# Patient Record
Sex: Female | Born: 1995 | Hispanic: No | Marital: Single | Smoking: Never smoker
Health system: Southern US, Community
[De-identification: ages and names within clinical notes are randomized; demographics above are authoritative.]

## PROBLEM LIST (undated history)

## (undated) DIAGNOSIS — Z9109 Other allergy status, other than to drugs and biological substances: Secondary | ICD-10-CM

## (undated) HISTORY — PX: KNEE SURGERY: SHX244

---
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
## 2015-12-18 ENCOUNTER — Emergency Department (HOSPITAL_COMMUNITY): Payer: BLUE CROSS/BLUE SHIELD

## 2015-12-18 ENCOUNTER — Encounter (HOSPITAL_COMMUNITY): Payer: Self-pay | Admitting: Emergency Medicine

## 2015-12-18 ENCOUNTER — Emergency Department (HOSPITAL_COMMUNITY)
Admission: EM | Admit: 2015-12-18 | Discharge: 2015-12-18 | Disposition: A | Discharge status: Home / Self Care | Payer: BLUE CROSS/BLUE SHIELD | Attending: Emergency Medicine | Admitting: Emergency Medicine

## 2015-12-18 DIAGNOSIS — Y92410 Unspecified street and highway as the place of occurrence of the external cause: Secondary | ICD-10-CM | POA: Diagnosis not present

## 2015-12-18 DIAGNOSIS — M542 Cervicalgia: Secondary | ICD-10-CM | POA: Diagnosis not present

## 2015-12-18 DIAGNOSIS — R51 Headache: Secondary | ICD-10-CM | POA: Diagnosis not present

## 2015-12-18 DIAGNOSIS — Y999 Unspecified external cause status: Secondary | ICD-10-CM | POA: Diagnosis not present

## 2015-12-18 DIAGNOSIS — M546 Pain in thoracic spine: Secondary | ICD-10-CM | POA: Diagnosis not present

## 2015-12-18 DIAGNOSIS — Y939 Activity, unspecified: Secondary | ICD-10-CM | POA: Diagnosis not present

## 2015-12-18 LAB — CBC
HCT: 38.5 % (ref 36.0–46.0)
Hemoglobin: 12.3 g/dL (ref 12.0–15.0)
MCH: 27.2 pg (ref 26.0–34.0)
MCHC: 31.9 g/dL (ref 30.0–36.0)
MCV: 85 fL (ref 78.0–100.0)
PLATELETS: 335 10*3/uL (ref 150–400)
RBC: 4.53 MIL/uL (ref 3.87–5.11)
RDW: 12.2 % (ref 11.5–15.5)
WBC: 4.7 10*3/uL (ref 4.0–10.5)

## 2015-12-18 LAB — COMPREHENSIVE METABOLIC PANEL
ALT: 29 U/L (ref 14–54)
AST: 20 U/L (ref 15–41)
Albumin: 3.7 g/dL (ref 3.5–5.0)
Alkaline Phosphatase: 58 U/L (ref 38–126)
Anion gap: 8 (ref 5–15)
BUN: 6 mg/dL (ref 6–20)
CALCIUM: 8.7 mg/dL — AB (ref 8.9–10.3)
CO2: 21 mmol/L — ABNORMAL LOW (ref 22–32)
CREATININE: 0.87 mg/dL (ref 0.44–1.00)
Chloride: 108 mmol/L (ref 101–111)
GFR calc non Af Amer: >60 mL/min (ref >60)
Glucose, Bld: 90 mg/dL (ref 65–99)
Potassium: 4 mmol/L (ref 3.5–5.1)
Sodium: 137 mmol/L (ref 135–145)
Total Bilirubin: 0.4 mg/dL (ref 0.3–1.2)
Total Protein: 6.6 g/dL (ref 6.5–8.1)

## 2015-12-18 LAB — I-STAT BETA HCG BLOOD, ED (MC, WL, AP ONLY): I-stat hCG, quantitative: <5 m[IU]/mL (ref <5)

## 2015-12-18 LAB — I-STAT CG4 LACTIC ACID, ED: Lactic Acid, Venous: 1.38 mmol/L (ref 0.5–1.9)

## 2015-12-18 MED ORDER — ONDANSETRON HCL 4 MG/2ML IJ SOLN
4.0000 mg | Freq: Once | INTRAMUSCULAR | Status: AC
  Administered 2015-12-18: 4 mg via INTRAVENOUS
  Filled 2015-12-18: qty 2

## 2015-12-18 MED ORDER — FENTANYL CITRATE (PF) 100 MCG/2ML IJ SOLN
25.0000 ug | Freq: Once | INTRAMUSCULAR | Status: AC
  Administered 2015-12-18: 25 ug via INTRAVENOUS
  Filled 2015-12-18: qty 2

## 2015-12-18 MED ORDER — KETOROLAC TROMETHAMINE 15 MG/ML IJ SOLN
15.0000 mg | Freq: Once | INTRAMUSCULAR | Status: AC
  Administered 2015-12-18: 15 mg via INTRAVENOUS
  Filled 2015-12-18: qty 1
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
## 2015-12-18 NOTE — ED Notes (Signed)
Pt in radiology 

## 2015-12-18 NOTE — ED Notes (Signed)
C-collar placed by EMS

## 2015-12-18 NOTE — ED Notes (Signed)
PT stating that her pain is increasing 8/10 and that she would like something to eat.  MD notified and pt given happy meal per MD.

## 2015-12-19 ENCOUNTER — Emergency Department (HOSPITAL_COMMUNITY)
Admission: EM | Admit: 2015-12-19 | Discharge: 2015-12-19 | Disposition: A | Discharge status: Home / Self Care | Payer: BLUE CROSS/BLUE SHIELD | Attending: Emergency Medicine | Admitting: Emergency Medicine

## 2015-12-19 ENCOUNTER — Encounter (HOSPITAL_COMMUNITY): Payer: Self-pay | Admitting: Emergency Medicine

## 2015-12-19 DIAGNOSIS — M542 Cervicalgia: Secondary | ICD-10-CM | POA: Diagnosis present

## 2015-12-19 DIAGNOSIS — Y939 Activity, unspecified: Secondary | ICD-10-CM | POA: Insufficient documentation

## 2015-12-19 DIAGNOSIS — Y92410 Unspecified street and highway as the place of occurrence of the external cause: Secondary | ICD-10-CM | POA: Diagnosis not present

## 2015-12-19 DIAGNOSIS — Y999 Unspecified external cause status: Secondary | ICD-10-CM | POA: Diagnosis not present

## 2015-12-19 MED ORDER — HYDROCODONE-ACETAMINOPHEN 5-325 MG PO TABS
1.0000 | ORAL_TABLET | Freq: Once | ORAL | Status: AC
  Administered 2015-12-19: 1 via ORAL
  Filled 2015-12-19: qty 1

## 2015-12-19 MED ORDER — IBUPROFEN 800 MG PO TABS: 800.0000 mg | ORAL_TABLET | Freq: Three times a day (TID) | ORAL | 0 refills | Status: DC

## 2015-12-19 MED ORDER — CYCLOBENZAPRINE HCL 10 MG PO TABS: 10.0000 mg | ORAL_TABLET | Freq: Two times a day (BID) | ORAL | 0 refills | Status: DC | PRN
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
## 2015-12-21 ENCOUNTER — Encounter (HOSPITAL_COMMUNITY): Payer: Self-pay | Admitting: Emergency Medicine

## 2015-12-21 DIAGNOSIS — Y999 Unspecified external cause status: Secondary | ICD-10-CM | POA: Diagnosis not present

## 2015-12-21 DIAGNOSIS — S161XXA Strain of muscle, fascia and tendon at neck level, initial encounter: Secondary | ICD-10-CM | POA: Diagnosis not present

## 2015-12-21 DIAGNOSIS — Y939 Activity, unspecified: Secondary | ICD-10-CM | POA: Insufficient documentation

## 2015-12-21 DIAGNOSIS — Y92410 Unspecified street and highway as the place of occurrence of the external cause: Secondary | ICD-10-CM | POA: Insufficient documentation

## 2015-12-21 DIAGNOSIS — S199XXA Unspecified injury of neck, initial encounter: Secondary | ICD-10-CM | POA: Diagnosis present

## 2015-12-21 LAB — URINALYSIS, ROUTINE W REFLEX MICROSCOPIC
BILIRUBIN URINE: NEGATIVE
GLUCOSE, UA: NEGATIVE mg/dL
KETONES UR: NEGATIVE mg/dL
Nitrite: NEGATIVE
PROTEIN: NEGATIVE mg/dL
Specific Gravity, Urine: 1.02 (ref 1.005–1.030)
pH: 7 (ref 5.0–8.0)

## 2015-12-21 LAB — COMPREHENSIVE METABOLIC PANEL
ALBUMIN: 3.6 g/dL (ref 3.5–5.0)
ALK PHOS: 61 U/L (ref 38–126)
ALT: 35 U/L (ref 14–54)
AST: 24 U/L (ref 15–41)
Anion gap: 8 (ref 5–15)
BILIRUBIN TOTAL: 0.4 mg/dL (ref 0.3–1.2)
BUN: 9 mg/dL (ref 6–20)
CALCIUM: 8.9 mg/dL (ref 8.9–10.3)
CO2: 25 mmol/L (ref 22–32)
CREATININE: 0.88 mg/dL (ref 0.44–1.00)
Chloride: 104 mmol/L (ref 101–111)
GFR calc Af Amer: >60 mL/min (ref >60)
GLUCOSE: 111 mg/dL — AB (ref 65–99)
POTASSIUM: 3.8 mmol/L (ref 3.5–5.1)
Sodium: 137 mmol/L (ref 135–145)
TOTAL PROTEIN: 6.6 g/dL (ref 6.5–8.1)

## 2015-12-21 LAB — CBC WITH DIFFERENTIAL/PLATELET
BASOS PCT: 0 %
Basophils Absolute: 0 10*3/uL (ref 0.0–0.1)
Eosinophils Absolute: 0.2 10*3/uL (ref 0.0–0.7)
Eosinophils Relative: 5 %
HEMATOCRIT: 38.8 % (ref 36.0–46.0)
Hemoglobin: 12.4 g/dL (ref 12.0–15.0)
Lymphocytes Relative: 37 %
Lymphs Abs: 1.8 10*3/uL (ref 0.7–4.0)
MCH: 27.3 pg (ref 26.0–34.0)
MCHC: 32 g/dL (ref 30.0–36.0)
MCV: 85.5 fL (ref 78.0–100.0)
MONO ABS: 0.5 10*3/uL (ref 0.1–1.0)
MONOS PCT: 9 %
NEUTROS ABS: 2.5 10*3/uL (ref 1.7–7.7)
Neutrophils Relative %: 49 %
Platelets: 311 10*3/uL (ref 150–400)
RBC: 4.54 MIL/uL (ref 3.87–5.11)
RDW: 12.2 % (ref 11.5–15.5)
WBC: 5 10*3/uL (ref 4.0–10.5)

## 2015-12-21 LAB — URINE MICROSCOPIC-ADD ON

## 2015-12-21 LAB — POC URINE PREG, ED: Preg Test, Ur: NEGATIVE

## 2015-12-21 NOTE — ED Notes (Signed)
Pt. reevaluated , reports mild dizziness/lightheaded , VS repeated , nurse explained delay , process and wait time to pt.
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
## 2015-12-22 ENCOUNTER — Emergency Department (HOSPITAL_COMMUNITY)
Admission: EM | Admit: 2015-12-22 | Discharge: 2015-12-22 | Disposition: A | Discharge status: Home / Self Care | Payer: BLUE CROSS/BLUE SHIELD | Attending: Emergency Medicine | Admitting: Emergency Medicine

## 2015-12-22 DIAGNOSIS — M7918 Myalgia, other site: Secondary | ICD-10-CM

## 2015-12-22 DIAGNOSIS — S161XXA Strain of muscle, fascia and tendon at neck level, initial encounter: Secondary | ICD-10-CM

## 2015-12-22 NOTE — ED Notes (Signed)
Pt verbalized understanding of discharge instructions and follow up care
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
## 2016-10-17 ENCOUNTER — Emergency Department (HOSPITAL_COMMUNITY)
Admission: EM | Admit: 2016-10-17 | Discharge: 2016-10-17 | Disposition: A | Discharge status: Home / Self Care | Payer: BLUE CROSS/BLUE SHIELD | Attending: Emergency Medicine | Admitting: Emergency Medicine

## 2016-10-17 ENCOUNTER — Emergency Department (HOSPITAL_COMMUNITY): Payer: BLUE CROSS/BLUE SHIELD

## 2016-10-17 ENCOUNTER — Encounter (HOSPITAL_COMMUNITY): Payer: Self-pay | Admitting: Emergency Medicine

## 2016-10-17 DIAGNOSIS — Z79899 Other long term (current) drug therapy: Secondary | ICD-10-CM | POA: Insufficient documentation

## 2016-10-17 DIAGNOSIS — Y92481 Parking lot as the place of occurrence of the external cause: Secondary | ICD-10-CM | POA: Diagnosis not present

## 2016-10-17 DIAGNOSIS — S161XXA Strain of muscle, fascia and tendon at neck level, initial encounter: Secondary | ICD-10-CM | POA: Insufficient documentation

## 2016-10-17 DIAGNOSIS — Y999 Unspecified external cause status: Secondary | ICD-10-CM | POA: Diagnosis not present

## 2016-10-17 DIAGNOSIS — Y939 Activity, unspecified: Secondary | ICD-10-CM | POA: Diagnosis not present

## 2016-10-17 DIAGNOSIS — M546 Pain in thoracic spine: Secondary | ICD-10-CM | POA: Insufficient documentation

## 2016-10-17 DIAGNOSIS — S199XXA Unspecified injury of neck, initial encounter: Secondary | ICD-10-CM | POA: Diagnosis present

## 2016-10-17 DIAGNOSIS — R0789 Other chest pain: Secondary | ICD-10-CM | POA: Insufficient documentation

## 2016-10-17 DIAGNOSIS — M25511 Pain in right shoulder: Secondary | ICD-10-CM | POA: Diagnosis not present

## 2016-10-17 DIAGNOSIS — T148XXA Other injury of unspecified body region, initial encounter: Secondary | ICD-10-CM

## 2016-10-17 LAB — POC URINE PREG, ED: Preg Test, Ur: NEGATIVE

## 2016-10-17 MED ORDER — IBUPROFEN 600 MG PO TABS: 600.0000 mg | ORAL_TABLET | Freq: Four times a day (QID) | ORAL | 0 refills | Status: AC | PRN

## 2016-10-17 MED ORDER — CYCLOBENZAPRINE HCL 10 MG PO TABS: 10.0000 mg | ORAL_TABLET | Freq: Two times a day (BID) | ORAL | 0 refills | Status: AC | PRN

## 2016-10-17 MED ORDER — ACETAMINOPHEN 500 MG PO TABS
1000.0000 mg | ORAL_TABLET | Freq: Once | ORAL | Status: AC
  Administered 2016-10-17: 1000 mg via ORAL
  Filled 2016-10-17: qty 2
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
## 2016-10-17 NOTE — Discharge Instructions (Signed)
You will likely be very sore for the next few days.  Take Flexeril as prescribed. This medication will make you drowsy so do not drive or drink alcohol when taking it.  You can take Ibuprofen as directed for pain.  Wear the cervical collar for the next 2 days to provide support and stabilization.  Follow-up with primary care doctor in the next 24-48 hours for further evaluation.  If you do not have a primary care doctor you see regularly, please you the list below. Please call them to arrange for follow-up.    No Primary Care Doctor Call [facility]  [phone] Other agencies that provide inexpensive medical care    [facility]  [phone]    [facility]  [phone]    [facility]  [phone]    [facility]  [phone]    [facility]  [phone]    [facility]  [phone]
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
## 2020-05-03 ENCOUNTER — Encounter (INDEPENDENT_AMBULATORY_CARE_PROVIDER_SITE_OTHER): Payer: Self-pay | Admitting: Emergency Medicine

## 2020-05-03 ENCOUNTER — Telehealth (INDEPENDENT_AMBULATORY_CARE_PROVIDER_SITE_OTHER): Payer: BLUE CROSS/BLUE SHIELD | Admitting: Emergency Medicine

## 2020-05-03 DIAGNOSIS — Z7689 Persons encountering health services in other specified circumstances: Secondary | ICD-10-CM

## 2020-05-03 DIAGNOSIS — M549 Dorsalgia, unspecified: Secondary | ICD-10-CM

## 2020-05-03 DIAGNOSIS — M62838 Other muscle spasm: Secondary | ICD-10-CM

## 2020-05-03 DIAGNOSIS — M542 Cervicalgia: Secondary | ICD-10-CM

## 2020-05-03 MED ORDER — CYCLOBENZAPRINE HCL 10 MG PO TABS
10.0000 mg | ORAL_TABLET | Freq: Three times a day (TID) | ORAL | 0 refills | Status: AC | PRN
Start: 2020-05-03 — End: ?
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
## 2020-05-03 NOTE — Progress Notes (Signed)
Have you seen any specialists/other providers since your last visit with us?    No    Arm preference verified?   No    There are no preventive care reminders to display for this patient.

## 2020-05-04 ENCOUNTER — Encounter (INDEPENDENT_AMBULATORY_CARE_PROVIDER_SITE_OTHER): Payer: Self-pay

## 2020-05-04 ENCOUNTER — Encounter (INDEPENDENT_AMBULATORY_CARE_PROVIDER_SITE_OTHER): Payer: Self-pay | Admitting: Emergency Medicine

## 2020-06-09 ENCOUNTER — Encounter (INDEPENDENT_AMBULATORY_CARE_PROVIDER_SITE_OTHER): Payer: Self-pay | Admitting: Emergency Medicine
